# Patient Record
Sex: Female | Born: 1970 | Hispanic: No | Marital: Married | Smoking: Never smoker
Health system: Southern US, Community
[De-identification: ages and names within clinical notes are randomized; demographics above are authoritative.]

---

## 2007-03-07 ENCOUNTER — Inpatient Hospital Stay (HOSPITAL_COMMUNITY): Admission: AD | Admit: 2007-03-07 | Discharge: 2007-03-07 | Payer: Self-pay | Admitting: Obstetrics and Gynecology

## 2007-03-11 ENCOUNTER — Inpatient Hospital Stay (HOSPITAL_COMMUNITY): Admission: AD | Admit: 2007-03-11 | Discharge: 2007-03-13 | Payer: Self-pay | Admitting: Obstetrics and Gynecology

## 2007-03-17 ENCOUNTER — Ambulatory Visit: Admission: RE | Admit: 2007-03-17 | Discharge: 2007-03-17 | Payer: Self-pay

## 2010-02-17 ENCOUNTER — Encounter: Payer: Self-pay | Admitting: Family Medicine

## 2010-02-18 ENCOUNTER — Encounter: Payer: Self-pay | Admitting: Obstetrics and Gynecology

## 2010-06-11 NOTE — H&P (Signed)
NAMELILLYEN, SCHOW NO.:  [id number]   MEDICAL RECORD NO.:  [id number]          PATIENT TYPE:  INP   LOCATION:  9173                          FACILITY:  [facility]   PHYSICIAN:  Osborn Coho, M.D.   DATE OF BIRTH:  October 14, 1970   DATE OF ADMISSION:  03/11/2007  DATE OF DISCHARGE:                              HISTORY & PHYSICAL   HISTORY OF PRESENT ILLNESS:  The patient is a 39 year old, married,  Arabic female, gravida 4, para 2-0-1-2 at 40-4/7 weeks for an [facility] of  March 07, 2007, who presents with questionable leakage of fluids since  8:30 a.m. on February 11 and a pinkish bloody show.  She reports  sporadic uterine contractions, fatigue, slight nausea early this morning  without vomiting.  The patient was seen Tuesday in the office.  Cervix  was 2.5, 70, and -2 and membranes were stripped.  The patient's cervix  at that time was posterior to the patient's left.  The patient denies  any PIH signs or symptoms, dysuria, any constipation or diarrhea,  shortness of breath, fever, cough, or other signs or symptoms of  infection.  Her pregnancy has been followed by the CNM service at  [facility].  She was a transfer of care around 19+ weeks from  [facility].  Her history is remarkable for (1) first trimester spotting,  (2) GBS negative, (3) RH positive blood type, (4) a cystectomy in 06.  The patient does report good fetal movement on admission.  She is  expecting a female.   PRENATAL LABS:  The patient is B positive, Rh antibody screen negative,  RPR nonreactive, rubella titer immune, hepatitis surface antigen  negative, HIV nonreactive, normal hemoglobin electrophoresis.  Gonorrhea  and chlamydia cultures on September 16 were negative.  Hemoglobin at  that time was 12.2, and platelets were 227.  Her group beta Strep was  negative.  She had a normal one-hour DTT, and her hemoglobin on November  17 was 11.6.   ALLERGIES:  THE PATIENT DENIES MEDICATION OR LATEX  ALLERGY OR OTHER  SENSITIVITIES.   OBSTETRICAL HISTORY:  Gravida 1 was spontaneous vaginal delivery July of  2001, female infant weighing 6.5, at [redacted] weeks gestation.  No  complications.  She was an induction and was in [place].  Gravida 2 was a miscarriage without complications around [redacted] weeks  gestation, and that was in March of 2004.  Gravida 3 was a spontaneous  vaginal delivery August of 05, another female infant weighing 6.1 at [redacted]  weeks gestation without complications, and that also was in [place],  [place], and gravida 4 is current pregnancy.   PAST MEDICAL HISTORY:  The patient has report of the miscarriage in  2004.  She reports light spotting with her second and current pregnancy.  Reports condoms for contraception in the past.  Reports last Pap smear  in July of 08 which was within normal limits.  She had a cyst removed in  2006.  Reports chicken pox as a child.   PAST SURGICAL HISTORY:  Unremarkable except for her wisdom teeth.   GENETIC HISTORY:  Unremarkable.   FAMILY HISTORY:  Her mother and maternal grandmother both with chronic  hypertension .   SOCIAL HISTORY:  The patient is a married Arabic female.  She does speak  [place].  She has her Ph.D. is a Holiday representative.  Father  of baby's name is Altomi Hasham.  He works full-time as an Art gallery manager.  Has his master's degree.  They do not report a religious affiliation,  and the patient denied tobacco, alcohol or illicit drug use.   HISTORY OF CURRENT PREGNANCY:  The patient initiated care with [facility] at 9-1/[redacted] weeks gestation where she had a new OB interview.  She  was transferring care from a private practice in [facility].  She had  her new OB at 19-4/7 weeks and had reported at that time some spotting  times one day in the pregnancy.  She had an anatomy scan around 20- 5/7  weeks showing SIUP with size consistent with dates.  Cervix was 3.8 cm,  normal fluid, suggestive female.  The  patient's pregnancy progressed  without any remarkable complications.  Around 35-5/7 weeks, the patient  had GBS done which was negative.  Her vaginal exam at that time was 1  and 50%, vertex, and -1.   PHYSICAL EXAMINATION:  VITAL SIGNS:  On admission, blood pressure  103/64, heart rate 82, temperature 98.4, and respirations were 18.  EFM  showing fetal heart rate around 140, reactive, moderate variability, and  occasional mild variable.  Tocometer showing uterine contractions  approximately every 8 to 10 minutes, mild on palpation.  GENERAL:  In no acute distress, alert and oriented times three.  SKIN:  Warm, dry and intact.  HEENT:  Within normal limits.  CARDIOVASCULAR:  Regular rate and rhythm without murmur.  LUNGS:  Clear to auscultation bilaterally.  ABDOMEN:  Gravid, fundal height approximately 40 cm, estimated fetal  weight 7 to 7-1/2 pounds.  ABDOMEN:  Soft and nontender.  PELVIC EXAM:  Speculum exam:  Negative pulling.  There was a watery  discharge with some bloody mucus which did lead to a positive Nitrazine.  She did, however, have a positive fern.  Cervix was 3.5 cm, 70% effaced,  -2 to -1 and vertex, and cervix was anterior.  EXTREMITIES:  Within normal limits.   IMPRESSION:  1. Intrauterine pregnancy at 40-4/7 weeks.  2. Spontaneous rupture of membranes (SROM).  3. Group beta Strep negative.  4. Desires epidural for labor.   PLAN:  1. Admit to birthing suite with Dr. Su Hilt as attending physician.  2. Routine L and D, CNM orders.  3. Plan ambulation and recheck cervix, and if no cervical change, plan      Pitocin augmentation p.r.n.  The patient may have epidural p.r.n.      and consult with Dr. Su Hilt as needed.      Candice [place], [place]      Osborn Coho, M.D.  Electronically Signed    CHS/MEDQ  D:  03/11/2007  T:  03/11/2007  Job:  [id number]

## 2010-10-18 LAB — CBC
HCT: 33.6 — ABNORMAL LOW
Hemoglobin: 11.5 — ABNORMAL LOW
Hemoglobin: 12.7
MCHC: 34
MCV: 77.8 — ABNORMAL LOW
Platelets: 174
RBC: 4.32
RBC: 4.82
WBC: 10.5
WBC: 8.1

## 2012-11-16 ENCOUNTER — Other Ambulatory Visit: Payer: Self-pay | Admitting: Family Medicine

## 2012-11-16 DIAGNOSIS — Z1231 Encounter for screening mammogram for malignant neoplasm of breast: Secondary | ICD-10-CM

## 2012-12-14 ENCOUNTER — Ambulatory Visit
Admission: RE | Admit: 2012-12-14 | Discharge: 2012-12-14 | Disposition: A | Discharge status: Home / Self Care | Payer: BC Managed Care – PPO | Source: Ambulatory Visit | Attending: Family Medicine | Admitting: Family Medicine

## 2012-12-14 DIAGNOSIS — Z1231 Encounter for screening mammogram for malignant neoplasm of breast: Secondary | ICD-10-CM

## 2014-09-08 ENCOUNTER — Other Ambulatory Visit: Payer: Self-pay

## 2014-09-08 DIAGNOSIS — Z1231 Encounter for screening mammogram for malignant neoplasm of breast: Secondary | ICD-10-CM

## 2014-10-20 ENCOUNTER — Ambulatory Visit: Payer: Self-pay

## 2014-11-20 ENCOUNTER — Ambulatory Visit
Admission: RE | Admit: 2014-11-20 | Discharge: 2014-11-20 | Disposition: A | Discharge status: Home / Self Care | Payer: BLUE CROSS/BLUE SHIELD | Source: Ambulatory Visit

## 2014-11-20 DIAGNOSIS — Z1231 Encounter for screening mammogram for malignant neoplasm of breast: Secondary | ICD-10-CM

## 2014-11-27 ENCOUNTER — Encounter: Payer: Self-pay | Admitting: Skilled Nursing Facility1

## 2014-11-27 ENCOUNTER — Encounter: Payer: BLUE CROSS/BLUE SHIELD | Attending: Obstetrics | Admitting: Skilled Nursing Facility1

## 2014-11-27 VITALS — Ht 64.0 in | Wt 162.0 lb

## 2014-11-27 DIAGNOSIS — Z713 Dietary counseling and surveillance: Secondary | ICD-10-CM | POA: Insufficient documentation

## 2014-11-27 DIAGNOSIS — R635 Abnormal weight gain: Secondary | ICD-10-CM | POA: Insufficient documentation

## 2014-11-27 NOTE — Patient Instructions (Signed)
-  Try to slow down when you eat; try 15 chews -Try to add a little more protein to your breakfast -Trial and error for your portion sizes -Try Kefir -Add a third day of exercise; try the elliptical or look into a trainer for weights

## 2014-11-27 NOTE — Progress Notes (Signed)
  Medical Nutrition Therapy:  Appt start time: 0800 end time:  0900.   Assessment:  Primary concerns today: referred for abnormal weight gain. Pt would like to lose weight. Pt states she loves sweets. Pt states last year she started a running group. Pt states her usual weight was 140 pounds for about a year and a half but that was when she was on the [employer] diet. Pts stated diet hx: juicing and pills. Pt states 160 pounds is her cut off weight and is worried to be over that weight. Pt states she has not hit menopause. Pt states she has no medical issues she just does not want to be that heavy. Pt complains of no symptoms.  Preferred Learning Style:   No preference indicated Learning Readiness:   Contemplating  MEDICATIONS: none   DIETARY INTAKE:  Usual eating pattern includes 3 meals and 2-3 snacks per day.  Everyday foods include none stated.  Avoided foods include pork.    24-hr recall:  B ( AM): toast with peanut butter and honey-----yogurt with granola and honey Snk ( AM): granola bar L ( PM): [place] sandwhich  Snk ( PM): banana----cookies D ( PM): salad, carbohydrate, vegetable, meat Snk ( PM): chocolate and fruit Beverages: coffee, infused water, tea  Usual physical activity: 2 times a week running group for 2.5 to 3 miles about 40 minutes  Estimated energy needs: 1600 calories 180 g carbohydrates 120 g protein 44 g fat  Progress Towards Goal(s):  In progress.   Nutritional Diagnosis:  NB-1.1 Food and nutrition-related knowledge deficit As related to no prior nutrition education from a nutrition professional.  As evidenced by pt report and 24 hr recall..    Intervention:  Nutrition counseling for weight loss. Dietitian educated the pt on health/body acceptance, balanced/varied meals, and increasing her physical activity routine. Goals: -Try to slow down when you eat; try 15 chews -Try to add a little more protein to your breakfast -Trial and error for your portion  sizes -Try Kefir -Add a third day of exercise; try the elliptical or look into a trainer for weights Teaching Method Utilized:  Visual Auditory  Handouts given during visit include:  Snack sheet  Barriers to learning/adherence to lifestyle change: none identified  Demonstrated degree of understanding via:  Teach Back   Monitoring/Evaluation:  Dietary intake, exercise, and body weight prn.

## 2016-11-09 IMAGING — MG MM SCREENING BREAST TOMO BILATERAL
6 of 9 series · 6 of 25 positions shown · non-contrast
Comparison: Previous exam(s).

CLINICAL DATA: Screening.

EXAM:
DIGITAL SCREENING BILATERAL MAMMOGRAM WITH 3D TOMO WITH CAD

[R MLO (1 of 2)]
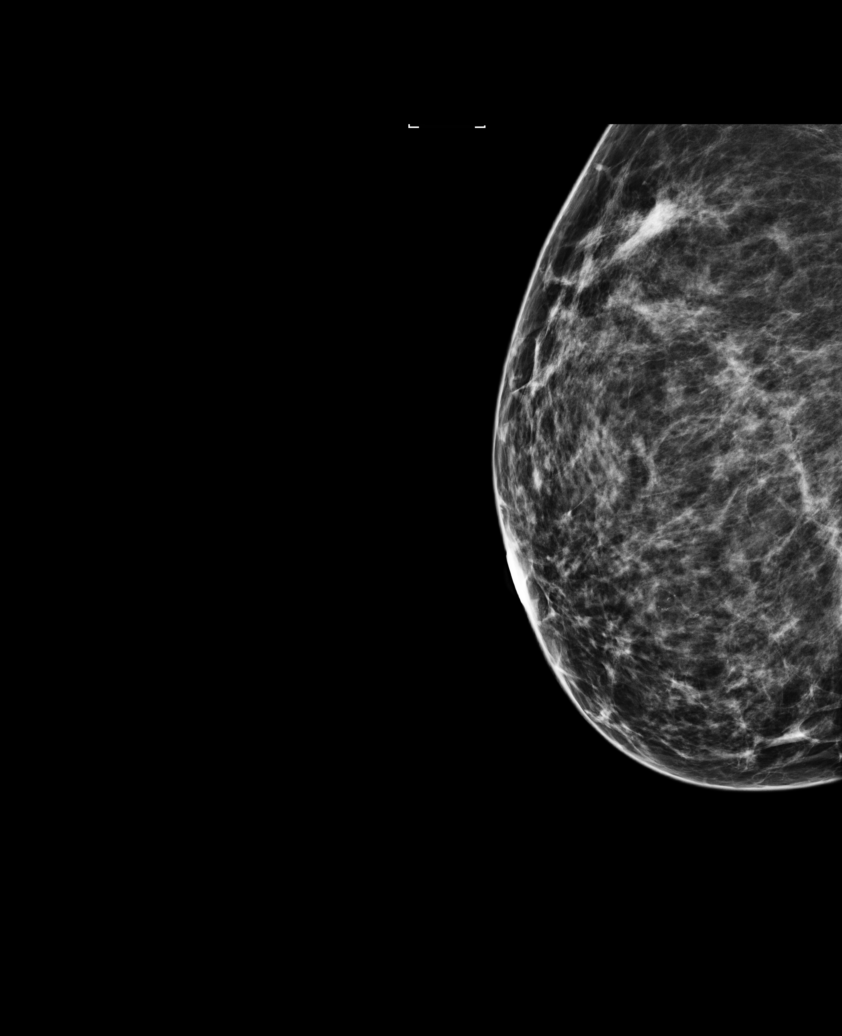

[L CC]
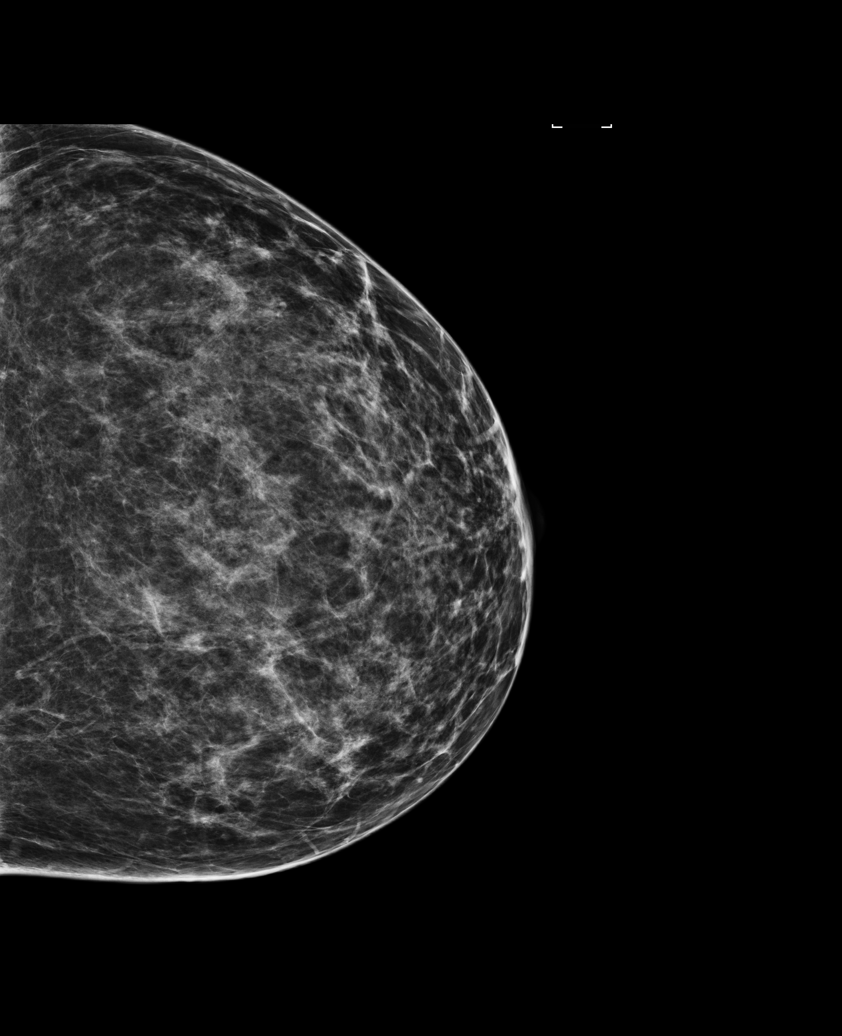

[L MLO]
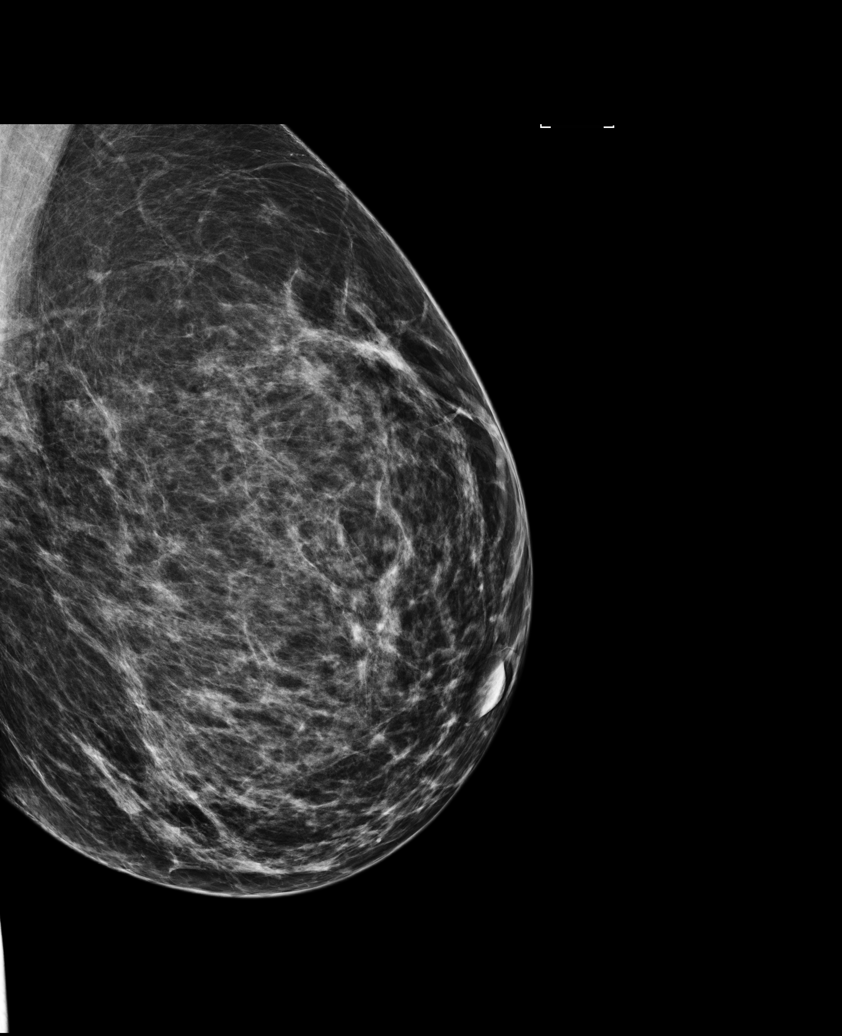

[R CC]
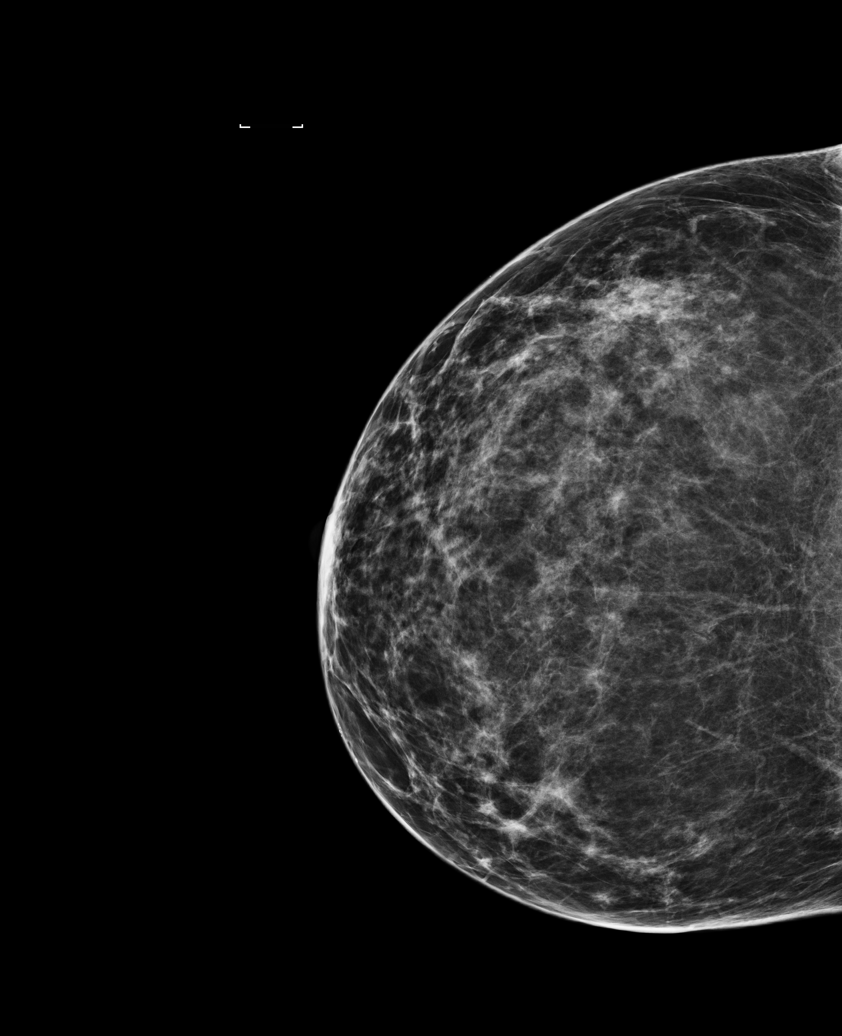

[R MLO (2 of 2)]
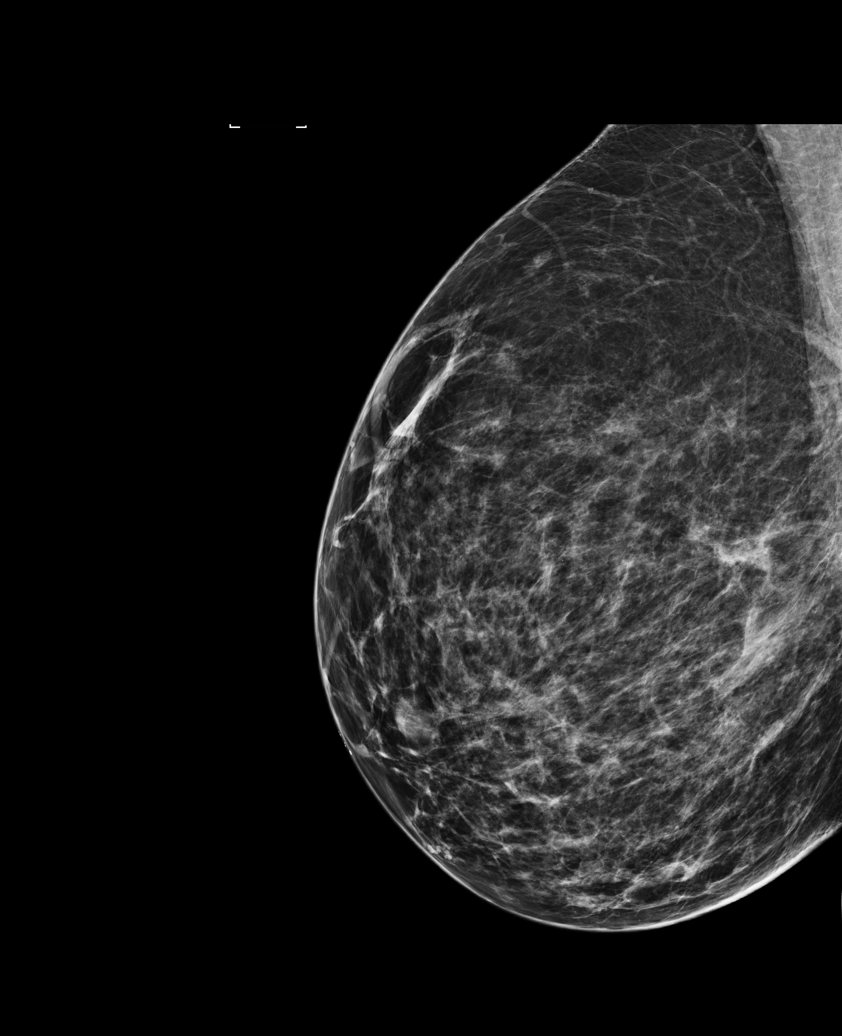

[R MLO tomo · tomo slice 41/80.0]
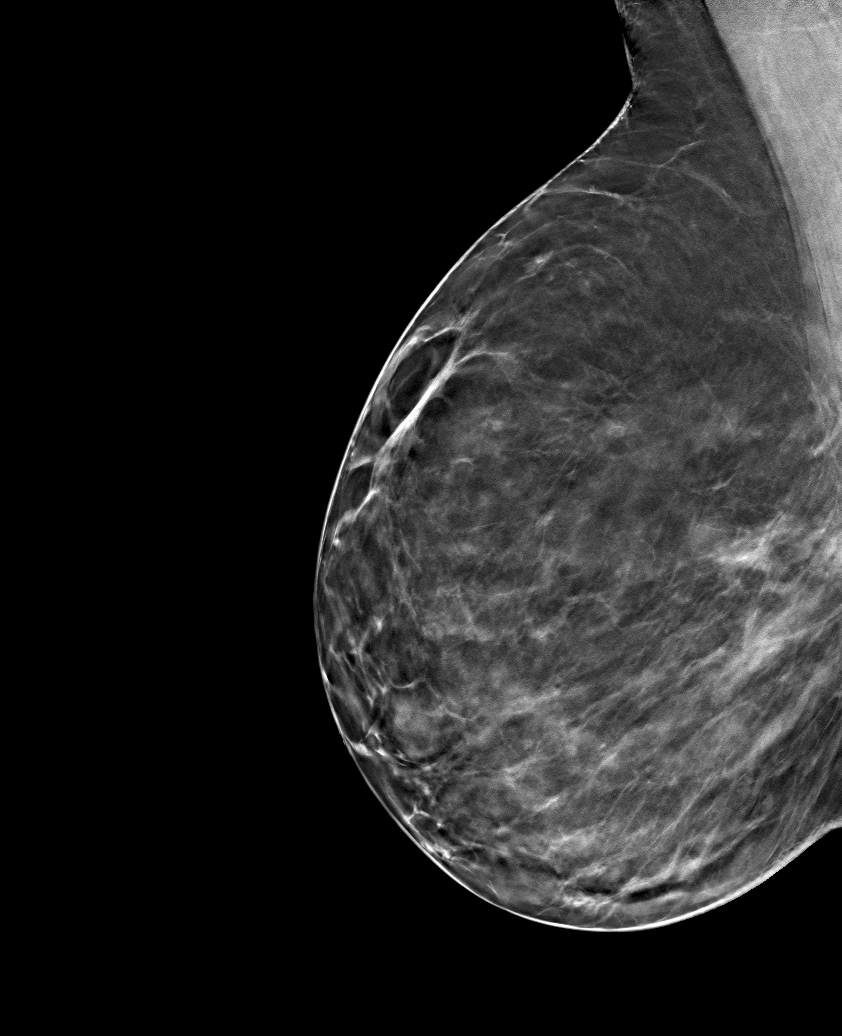

[6 of 25 positions shown; findings below may reference images not displayed]

ACR Breast Density Category b: There are scattered areas of
fibroglandular density.
FINDINGS: There are no findings suspicious for malignancy. Images were
processed with CAD.
IMPRESSION: No mammographic evidence of malignancy. A result letter of this
screening mammogram will be mailed directly to the patient.

RECOMMENDATION:
Screening mammogram in one year. (Code:55-L-23V)

BI-RADS CATEGORY  1: Negative.

## 2017-04-06 ENCOUNTER — Other Ambulatory Visit: Payer: Self-pay | Admitting: Podiatry

## 2017-04-06 ENCOUNTER — Ambulatory Visit: Payer: BLUE CROSS/BLUE SHIELD | Admitting: Podiatry

## 2017-04-06 ENCOUNTER — Ambulatory Visit (INDEPENDENT_AMBULATORY_CARE_PROVIDER_SITE_OTHER): Payer: BLUE CROSS/BLUE SHIELD

## 2017-04-06 DIAGNOSIS — M21619 Bunion of unspecified foot: Secondary | ICD-10-CM

## 2017-04-06 DIAGNOSIS — M2042 Other hammer toe(s) (acquired), left foot: Secondary | ICD-10-CM | POA: Diagnosis not present

## 2017-04-06 DIAGNOSIS — M2041 Other hammer toe(s) (acquired), right foot: Secondary | ICD-10-CM

## 2017-04-06 DIAGNOSIS — M7751 Other enthesopathy of right foot: Secondary | ICD-10-CM | POA: Diagnosis not present

## 2017-04-06 DIAGNOSIS — R52 Pain, unspecified: Secondary | ICD-10-CM

## 2017-04-06 MED ORDER — MELOXICAM 15 MG PO TABS: 15.0000 mg | ORAL_TABLET | Freq: Every day | ORAL | 0 refills | Status: DC

## 2017-04-06 NOTE — Progress Notes (Signed)
   Subjective:    Patient ID: Lisa Brennan, female    DOB: April 24, 1970, 47 y.o.   MRN: [id number]  HPI    Review of Systems  All other systems reviewed and are negative.      Objective:   Physical Exam        Assessment & Plan:

## 2017-04-06 NOTE — Addendum Note (Signed)
Addended by: Felecia ShellingEVANS, Carley Glendenning M on: 04/06/2017 09:02 AM   Modules accepted: Orders

## 2017-04-06 NOTE — Progress Notes (Addendum)
Patient ID: Lisa Brennan, female   DOB: 11-30-1970, 47 y.o.   MRN: [id number]   Subjective: 47 year old female presents the office today for evaluation of pain and tenderness the first and second toes of the right foot.  She states that the bump on the inside of her foot hurts with certain shoe gear and with prolonged periods of standing.  She experience the pain for the past few months.  She has not really tried anything to alleviate symptoms.  She is not interested in surgery and would like to pursue conservative modalities to see if she can alleviate symptoms.    Objective: Physical Exam General: The patient is alert and oriented x3 in no acute distress.  Dermatology: Skin is cool, dry and supple bilateral lower extremities. Negative for open lesions or macerations.  Vascular: Palpable pedal pulses bilaterally. No edema or erythema noted. Capillary refill within normal limits.  Neurological: Epicritic and protective threshold grossly intact bilaterally.   Musculoskeletal Exam: Clinical evidence of bunion deformity noted to the respective foot. There is a moderate pain on palpation range of motion of the first MPJ. Lateral deviation of the hallux noted consistent with hallux abductovalgus. Hammertoe contracture also noted on clinical exam to digits second digits of the bilateral foot. Symptomatic pain on palpation and range of motion also noted to the metatarsal phalangeal joints of the respective hammertoe digits.    Radiographic Exam: Increased intermetatarsal angle greater than 15 with a hallux abductus angle greater than 30 noted on AP view. Moderate degenerative changes noted within the first MPJ. Contracture deformity also noted to the interphalangeal joints and MPJs of the digits of the respective hammertoes.    Assessment: 1. 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 Hammertoe deformity bilateral 3.  Metatarsophalangeal capsulitis second MPJ right   Plan of Care:  1.  Patient was evaluated. X-Rays reviewed. 2.  Today explained that the best alternative to alleviate some pain and pressure from the second MPJ and also to alleviate bunion pain is wide fitting shoe gear.  The patient has a history of buying larger sizes to accommodate the foot pathology, however I recommend find the appropriate size shoe and a wide width. 3.  Also appointment today with Lisa Brennan, Pedorthist for custom molded orthotics with possible metatarsal offloading pad to alleviate pressure from the second MPJ bilateral 4.  Prescription for meloxicam 15 mg to take as needed 5.  Return to clinic as needed  * Name is pronounced 'Nay-head'.  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   Felecia ShellingBrent M. Foye Damron, DPM [facility]  Dr. Felecia ShellingBrent M. Sequan Auxier, DPM    [address]                                        [place], [place] [address]                Office [phone]  Fax [phone]

## 2017-04-20 ENCOUNTER — Other Ambulatory Visit: Payer: BLUE CROSS/BLUE SHIELD | Admitting: Orthotics

## 2017-09-03 DIAGNOSIS — E785 Hyperlipidemia, unspecified: Secondary | ICD-10-CM | POA: Insufficient documentation

## 2017-09-03 DIAGNOSIS — E663 Overweight: Secondary | ICD-10-CM | POA: Insufficient documentation

## 2017-09-14 ENCOUNTER — Encounter: Payer: Self-pay | Admitting: Sports Medicine

## 2017-09-14 ENCOUNTER — Ambulatory Visit: Payer: PRIVATE HEALTH INSURANCE | Admitting: Sports Medicine

## 2017-09-14 VITALS — BP 122/82 | Ht 64.0 in | Wt 160.0 lb

## 2017-09-14 DIAGNOSIS — M7741 Metatarsalgia, right foot: Secondary | ICD-10-CM

## 2017-09-14 NOTE — Patient Instructions (Signed)
  You have metatarsalgia in your right foot. Please try the metatarsal pad in your running shoes. If it is helpful, then you can order more from from [facility].  I think you have either a little synovitis or arthritis in the pointer finger of your right hand. I would recommend over-the-counter treatments such as Aspercreme or capsaicin as needed. You can also try a Band-Aid splint over the joint at night.

## 2017-09-15 ENCOUNTER — Encounter: Payer: Self-pay | Admitting: Sports Medicine

## 2017-09-15 NOTE — Progress Notes (Signed)
   Subjective:    Patient ID: Lisa Brennan, female    DOB: 30-Jul-1970, 47 y.o.   MRN: [id number]  HPI chief complaint: Right foot pain and right index finger pain  47 year old female comes in today with a couple of different complaints. Main complaint is right foot pain she localizes to the plantar aspect of her foot directly over the second metatarsal head.She saw a podiatrist here locally who told her that her pain may be from her bunion.She denies any pain in the first MTP joint. She denies numbness and tingling although she does endorse some numbness when running. She enjoys running and has been running for about 5 years. She does not recall any trauma to her foot. She's not noticed any swelling. She's also complaining of intermittent discomfort at the PIP joint of the right index finger. No trauma. She does notice occasional swelling. She denies pain elsewhere in her hands or wrists.  Past medical history reviewed Medications reviewed Allergies reviewed    Review of Systems    as above Objective:   Physical Exam  Well-developed, well-nourished. No acute distress. Awake alert and oriented 3. Vital signs reviewed.  Right foot: Patient is tender to palpation directly over the second metatarsal head on the plantar aspect of the foot. There is a slight amount of callus formation here as. No palpable neuroma. No soft tissue swelling. No tenderness to palpation across the dorsum of the foot. No pain with metatarsal squeeze. Examination of her foot in the standing position shows a moderate bunion with slight hallux valgus but she has no tenderness to palpation directly over the first MTP joint. Mild hallux rigidus. Longitudinal arch is fairly well preserved but there is a complete collapse of the transverse arches bilaterally. Good pulses. She walks without a limp.  Examination of the right hand with attention to the right index finger does show some mild fusiform swelling at the PIP joint.  No effusion. Joint is not warm to touch. Slight tenderness to palpation along the ulnar aspect of the joint. Good joint stability. Full range of motion. Brisk capillary refill.      Assessment & Plan:   Right foot pain secondary to metatarsalgia Right index finger PIP pain and swelling likely secondary to synovitis versus mild osteoarthritis  Green insert with metatarsal pad for her metatarsalgia. She did not bring her running shoes with her so we educated her on proper placement of the pad. If she struggles with this at home, she may return to the office and we can help her place the pad in the correct position. I recommended no workup or treatment in regards to her finger pain other than over-the-counter remedies such as Aspercreme or capsaicin. She may also try a simple Band-Aid splint over the joint at night if she finds it difficult to sleep. Follow-up with me as needed.

## 2017-10-28 DIAGNOSIS — S76011A Strain of muscle, fascia and tendon of right hip, initial encounter: Secondary | ICD-10-CM | POA: Insufficient documentation

## 2017-12-22 ENCOUNTER — Encounter

## 2017-12-22 ENCOUNTER — Encounter: Payer: Self-pay | Admitting: Sports Medicine

## 2017-12-22 ENCOUNTER — Ambulatory Visit (INDEPENDENT_AMBULATORY_CARE_PROVIDER_SITE_OTHER): Payer: PRIVATE HEALTH INSURANCE

## 2017-12-22 ENCOUNTER — Ambulatory Visit: Payer: PRIVATE HEALTH INSURANCE | Admitting: Sports Medicine

## 2017-12-22 ENCOUNTER — Other Ambulatory Visit: Payer: Self-pay

## 2017-12-22 DIAGNOSIS — M7751 Other enthesopathy of right foot: Secondary | ICD-10-CM

## 2017-12-22 DIAGNOSIS — M79671 Pain in right foot: Secondary | ICD-10-CM

## 2017-12-22 MED ORDER — TRIAMCINOLONE ACETONIDE 10 MG/ML IJ SUSP
10.0000 mg | Freq: Once | INTRAMUSCULAR | Status: AC
  Administered 2017-12-22: 10 mg

## 2017-12-22 NOTE — Patient Instructions (Signed)
Capsulitis of the Second Toe What Is Capsulitis of the Second Toe?  Ligaments surrounding the joint at the base of the second toe form a capsule, which helps the joint to function properly. Capsulitis is a condition in which these ligaments have become inflamed. Although capsulitis can also occur in the joints of the third or fourth toes, it most commonly affects the second toe. This inflammation causes considerable discomfort and, if left untreated, can eventually lead to a weakening of surrounding ligaments that can cause dislocation of the toe. Capsulitis-also referred to as predislocation syndrome-is a common condition that can occur at any age.  Causes  It is generally believed that capsulitis of the second toe is a result of abnormal foot mechanics, where the ball of the foot beneath the toe joint takes an excessive amount of weightbearing pressure. Certain conditions or characteristics can make a person prone to experiencing excessive pressure on the ball of the foot. These most commonly include a severe bunion deformity, a second toe longer than the big toe, an arch that is structurally unstable and a tight calf muscle. Symptoms Because capsulitis of the second toe is a progressive disorder and usually worsens if left untreated, early recognition and treatment are important. In the earlier stages-the best time to seek treatment-the symptoms may include: Pain, particularly on the ball of the foot. It can feel like there's a marble in the shoe or a sock is bunched up  Swelling in the area of pain, including the base of the toe  Difficulty wearing shoes  Pain when walking barefoot In more advanced stages, the supportive ligaments weaken, leading to failure of the joint to stabilize the toe. The unstable toe drifts toward the big toe and eventually crosses over and lies on top of the big toe-resulting in crossover toe, the end stage of capsulitis. The symptoms of crossover toe are the same as those  experienced during the earlier stages. Although the crossing over of the toe usually occurs over a period of time, it can appear more quickly if caused by injury or overuse. Diagnosis An accurate diagnosis is essential because the symptoms of capsulitis can be similar to those of a condition called Morton's neuroma, which is treated differently from capsulitis. In arriving at a diagnosis, the foot and ankle surgeon will examine the foot, press on it and maneuver it to reproduce the symptoms. The surgeon will also look for potential causes and test the stability of the joint. X-rays are usually ordered, and other imaging studies are sometimes needed. Nonsurgical Treatment The best time to treat capsulitis of the second toe is during the early stages, before the toe starts to drift toward the big toe. At that time, nonsurgical approaches can be used to stabilize the joint, reduce the symptoms and address the underlying cause of the condition. The foot and ankle surgeon may select one or more of the following options for early treatment of capsulitis: Rest and ice. Staying off the foot and applying ice packs help reduce the swelling and pain. Apply an ice pack, placing a thin towel between the ice and the skin. Use ice for 20 minutes and then wait at least 40 minutes before icing again.  Injection/Oral medications. Steroid and Nonsteroidal anti-inflammatory drugs (NSAIDs), such as ibuprofen, may help relieve the pain and inflammation.  Taping/splinting. It may be necessary to tape the toe so that it will stay in the correct position. This helps relieve the pain and prevent further drifting of the toe.  Stretching. Stretching exercises may be prescribed for patients who have tight calf muscles.  Shoe modifications. Supportive shoes with stiff soles are recommended because they control the motion and lessen the amount of pressure on the ball of the foot.  Orthotic devices. Custom shoe inserts are often very  beneficial. These include arch supports or a metatarsal pad that distributes the weight away from the joint. When Is Surgery Needed? Once the second toe starts moving toward the big toe, it will never go back to its normal position unless surgery is performed. The foot and ankle surgeon will select the procedure or combination of procedures best suited to the individual patient.

## 2017-12-22 NOTE — Progress Notes (Signed)
Subjective: Danilyn Cocke is a 47 y.o. female patient who presents to office for evaluation of continued right foot pain. Patient complains of progressive pain especially over the last 10 months in the ball of the foot. Ranks pain 5-7/10 and is now interferring with daily activities and reports that she even had to stop running due to the pain.  Patient reports that pain is constant anytime she is walking and putting pressure to the ball of the foot.  Patient reports that after last visit she ended up seeing a another foot doctor who said she had metatarsalgia and recommended some padding but the area still not get better. Patient denies any other pedal complaints. Denies injury/trip/fall/sprain/any causative factors that could have contributed to this pain besides doing a lot of walking in [place] during a trip and then after she came back had pain ever since.  Review of Systems  Musculoskeletal: Positive for joint pain.  All other systems reviewed and are negative.    Patient Active Problem List   Diagnosis Date Noted  . Strain of hip flexor, right, initial encounter 10/28/2017  . Dyslipidemia 09/03/2017  . Overweight 09/03/2017    Current Outpatient Medications on File Prior to Visit  Medication Sig Dispense Refill  . meloxicam (MOBIC) 15 MG tablet Take 1 tablet (15 mg total) by mouth daily. 30 tablet 0  . PARAGARD INTRAUTERINE COPPER IUD IUD ParaGard T 380A 380 square mm intrauterine device  Take by intrauterine route.     No current facility-administered medications on file prior to visit.     No Known Allergies  Objective:  General: Alert and oriented x3 in no acute distress  Dermatology: No open lesions bilateral lower extremities, no webspace macerations, no ecchymosis bilateral, all nails x 10 are well manicured.  Vascular: Dorsalis Pedis and Posterior Tibial pedal pulses palpable, Capillary Fill Time 3 seconds,(+) pedal hair growth bilateral, no edema bilateral lower  extremities, Temperature gradient within normal limits.  Neurology: Michaell Cowing sensation intact via light touch bilateral.  Musculoskeletal: Mild tenderness with palpation at plantar second metatarsal head right foot consistent with capsulitis with mild bunion deformity and likely functional hallux limitus with overload at the second metatarsophalangeal joint on right with very minimal shear callus noted likely secondary to biomechanics, Strength within normal limits in all groups bilateral.   Gait: Antalgic gait  Xrays  Right foot   Impression: Normal osseous mineralization there is mild bunion deformity and long second metatarsal noted with mild decrease second metatarsophalangeal joint space.  Mild soft tissue swelling.  No other acute findings.  Assessment and Plan: Problem List Items Addressed This Visit    None    Visit Diagnoses    Capsulitis of metatarsophalangeal (MTP) joint of right foot    -  Primary   Relevant Medications   triamcinolone acetonide (KENALOG) 10 MG/ML injection 10 mg (Completed)   Other Relevant Orders   DG Foot Complete Right (Completed)   Right foot pain           -Complete examination performed -Xrays reviewed -Discussed treatement options likely capsulitis secondary to foot biomechanics -After oral consent and aseptic prep, injected a mixture containing 1 ml of 2%  plain lidocaine, 1 ml 0.5% plain marcaine, 0.5 ml of kenalog 10 and 0.5 ml of dexamethasone phosphate into right second metatarsophalangeal joint via dorsal approach without complication. Post-injection care discussed with patient.  -Apply plantar foot strapping to second toe and advised patient to do the same thing daily and advised patient at  bedtime to remove taping and use Darco toe splint as dispensed at today's visit -Advised patient next visit we will reassess her for the need of orthotics and also allow her to see Raiford NobleRick to have these orthotics molded to help long-term with keeping pain and  overuse of the joints minimal to prevent flare if her pain is better next visit -Recommend continue with good supportive shoes rest ice elevation and topical pain creams and rubs as needed -Advised patient to avoid strenuous activity that could increase pain to ball of foot -Patient to return to office within 3 weeks or sooner if condition worsens.  Asencion Islamitorya Olamide Carattini, DPM

## 2018-01-12 ENCOUNTER — Encounter: Payer: Self-pay | Admitting: Sports Medicine

## 2018-01-12 ENCOUNTER — Ambulatory Visit: Payer: PRIVATE HEALTH INSURANCE | Admitting: Sports Medicine

## 2018-01-12 DIAGNOSIS — M79671 Pain in right foot: Secondary | ICD-10-CM

## 2018-01-12 DIAGNOSIS — M2041 Other hammer toe(s) (acquired), right foot: Secondary | ICD-10-CM | POA: Diagnosis not present

## 2018-01-12 DIAGNOSIS — M2042 Other hammer toe(s) (acquired), left foot: Secondary | ICD-10-CM

## 2018-01-12 DIAGNOSIS — M21619 Bunion of unspecified foot: Secondary | ICD-10-CM

## 2018-01-12 DIAGNOSIS — M7751 Other enthesopathy of right foot: Secondary | ICD-10-CM | POA: Diagnosis not present

## 2018-01-12 NOTE — Progress Notes (Signed)
Subjective: Lisa Brennan is a 47 y.o. female patient who follows up for evaluation of right foot pain; patient reports that her foot is doing better. No pain at all. Reports injection last visit helped. Patient as also been taping her toe which also helps. Patient denies any other pedal complaints. No changes with health or medications since last visit.    Patient Active Problem List   Diagnosis Date Noted  . Strain of hip flexor, right, initial encounter 10/28/2017  . Dyslipidemia 09/03/2017  . Overweight 09/03/2017    Current Outpatient Medications on File Prior to Visit  Medication Sig Dispense Refill  . LOMAIRA 8 MG TABS     . meloxicam (MOBIC) 15 MG tablet Take 1 tablet (15 mg total) by mouth daily. 30 tablet 0  . PARAGARD INTRAUTERINE COPPER IUD IUD ParaGard T 380A 380 square mm intrauterine device  Take by intrauterine route.    . Phentermine HCl 8 MG TABS 1 tab, po, BID    . triamcinolone ointment (KENALOG) 0.1 % triamcinolone acetonide 0.1 % topical ointment  APPLY A THIN LAYER TO THE AFFECTED AREA(S) BY TOPICAL ROUTE 2 TIMES PER DAY    . Vitamin D, Ergocalciferol, (DRISDOL) 1.25 MG (50000 UT) CAPS capsule      No current facility-administered medications on file prior to visit.     No Known Allergies  Objective:  General: Alert and oriented x3 in no acute distress  Dermatology: No open lesions bilateral lower extremities, no webspace macerations, no ecchymosis bilateral, all nails x 10 are well manicured.  Vascular: Dorsalis Pedis and Posterior Tibial pedal pulses palpable, Capillary Fill Time 3 seconds,(+) pedal hair growth bilateral, no edema bilateral lower extremities, Temperature gradient within normal limits.  Neurology: Gross sensation intact via light touch bilateral.  Musculoskeletal:No tenderness with palpation at plantar second metatarsal head right foot, + mild bunion deformity and likely functional hallux limitus with overload at the second  metatarsophalangeal joint on right with very minimal shear callus noted likely secondary to biomechanics, Strength within normal limits in all groups bilateral.   Assessment and Plan: Problem List Items Addressed This Visit    None    Visit Diagnoses    Right foot pain    -  Primary   Capsulitis of metatarsophalangeal (MTP) joint of right foot       Bunion       Hammer toes of both feet           -Complete examination performed -Re-Discussed treatement options and long term care for capsulitis secondary to foot biomechanics -Patient seen by Ric for orthotics to help offload sub 2 on right -Continue with toe splinting until she has got her orthotics -Recommend continue with good supportive shoes rest ice elevation and topical pain creams and rubs as needed -Refrain from running until she has got her orthotics  -Patient to return to office [facility] or sooner if condition worsens.  Asencion Islamitorya Analleli Gierke, DPM

## 2018-01-18 ENCOUNTER — Ambulatory Visit (INDEPENDENT_AMBULATORY_CARE_PROVIDER_SITE_OTHER): Payer: PRIVATE HEALTH INSURANCE | Admitting: Orthotics

## 2018-01-18 DIAGNOSIS — M7752 Other enthesopathy of left foot: Secondary | ICD-10-CM

## 2018-01-18 DIAGNOSIS — M7751 Other enthesopathy of right foot: Secondary | ICD-10-CM | POA: Diagnosis not present

## 2018-01-18 DIAGNOSIS — M79671 Pain in right foot: Secondary | ICD-10-CM

## 2018-01-18 NOTE — Progress Notes (Signed)
Patient came into today to be cast for Custom Foot Orthotics. Upon recommendation of Dr. Marylene LandStover Patient presents with metatarsalagia, 2nd Rt capulitis Goals are offloading 2nd cap R  Plan vendor [employer]

## 2018-02-08 ENCOUNTER — Ambulatory Visit: Payer: PRIVATE HEALTH INSURANCE | Admitting: Orthotics

## 2018-02-08 DIAGNOSIS — M21619 Bunion of unspecified foot: Secondary | ICD-10-CM

## 2018-02-08 DIAGNOSIS — M2041 Other hammer toe(s) (acquired), right foot: Secondary | ICD-10-CM

## 2018-02-08 DIAGNOSIS — M79671 Pain in right foot: Secondary | ICD-10-CM

## 2018-02-08 DIAGNOSIS — M7751 Other enthesopathy of right foot: Secondary | ICD-10-CM

## 2018-02-08 DIAGNOSIS — M2042 Other hammer toe(s) (acquired), left foot: Secondary | ICD-10-CM

## 2018-02-08 NOTE — Progress Notes (Signed)
Have to send back to Richy to correct, offload is on wrong side.

## 2018-02-22 ENCOUNTER — Other Ambulatory Visit: Payer: PRIVATE HEALTH INSURANCE | Admitting: Orthotics

## 2018-03-02 ENCOUNTER — Encounter: Payer: PRIVATE HEALTH INSURANCE | Admitting: Orthotics

## 2018-03-09 ENCOUNTER — Ambulatory Visit (INDEPENDENT_AMBULATORY_CARE_PROVIDER_SITE_OTHER): Payer: PRIVATE HEALTH INSURANCE | Admitting: Orthotics

## 2018-03-09 DIAGNOSIS — M2042 Other hammer toe(s) (acquired), left foot: Principal | ICD-10-CM

## 2018-03-09 DIAGNOSIS — M2041 Other hammer toe(s) (acquired), right foot: Secondary | ICD-10-CM

## 2018-03-09 NOTE — Progress Notes (Signed)
Patient came in today to pick up custom made foot orthotics.  The goals were accomplished and the patient reported no dissatisfaction with said orthotics.  Patient was advised of breakin period and how to report any issues. 

## 2018-03-29 ENCOUNTER — Telehealth: Payer: Self-pay | Admitting: Sports Medicine

## 2018-03-29 DIAGNOSIS — M7751 Other enthesopathy of right foot: Secondary | ICD-10-CM | POA: Diagnosis not present

## 2018-03-29 DIAGNOSIS — M21612 Bunion of left foot: Secondary | ICD-10-CM | POA: Diagnosis not present

## 2018-03-29 NOTE — Telephone Encounter (Signed)
Pt left message stating she wants to order a second pair of orthotics this pair for athletic style shoes.    I returned call and pt is wanting a second pair order made for the athletic style shoes and is wanting to bill the insurance for them. Pt aware Raiford Noble is out of the office for the next week and they will be ordered when he gets back.

## 2018-04-06 ENCOUNTER — Ambulatory Visit (INDEPENDENT_AMBULATORY_CARE_PROVIDER_SITE_OTHER): Payer: PRIVATE HEALTH INSURANCE | Admitting: Sports Medicine

## 2018-04-06 ENCOUNTER — Encounter: Payer: Self-pay | Admitting: Sports Medicine

## 2018-04-06 DIAGNOSIS — M2042 Other hammer toe(s) (acquired), left foot: Secondary | ICD-10-CM

## 2018-04-06 DIAGNOSIS — S76911D Strain of unspecified muscles, fascia and tendons at thigh level, right thigh, subsequent encounter: Secondary | ICD-10-CM

## 2018-04-06 DIAGNOSIS — M216X1 Other acquired deformities of right foot: Secondary | ICD-10-CM

## 2018-04-06 DIAGNOSIS — S76011D Strain of muscle, fascia and tendon of right hip, subsequent encounter: Secondary | ICD-10-CM

## 2018-04-06 DIAGNOSIS — M7751 Other enthesopathy of right foot: Secondary | ICD-10-CM

## 2018-04-06 DIAGNOSIS — M79671 Pain in right foot: Secondary | ICD-10-CM

## 2018-04-06 DIAGNOSIS — M2041 Other hammer toe(s) (acquired), right foot: Secondary | ICD-10-CM

## 2018-04-06 DIAGNOSIS — M21619 Bunion of unspecified foot: Secondary | ICD-10-CM

## 2018-04-06 MED ORDER — TRIAMCINOLONE ACETONIDE 10 MG/ML IJ SUSP
10.0000 mg | Freq: Once | INTRAMUSCULAR | Status: AC
  Administered 2018-04-06: 10 mg

## 2018-04-06 NOTE — Progress Notes (Signed)
Subjective: Lisa Brennan is a 48 y.o. female patient who follows up for evaluation of right foot pain; patient reports that her pain is slowly coming back of the ball of her right foot states that when she flexes her foot she can feel a pull that radiates over the plantar surface of her foot that radiates to her leg and upper thigh and is unsure if the issues of what is been going on with her hip is contributing to her foot but does state that she got her orthotics roughly about 3 to 4 weeks ago and states that the pain started to slowly come back after wearing her orthotics and is unsure if this is also contributing to increased episode of pain.  Patient states that she has been trying to be active has stopped running and has been doing more low impact exercises in the gym over has no foot and pain is causing her to limp.  Patient denies any other pedal complaints at this time.   Patient Active Problem List   Diagnosis Date Noted  . Strain of hip flexor, right, initial encounter 10/28/2017  . Dyslipidemia 09/03/2017  . Overweight 09/03/2017    Current Outpatient Medications on File Prior to Visit  Medication Sig Dispense Refill  . Phentermine HCl 8 MG TABS 1 tab, po, BID    . LOMAIRA 8 MG TABS     . meloxicam (MOBIC) 15 MG tablet Take 1 tablet (15 mg total) by mouth daily. (Patient not taking: Reported on 04/06/2018) 30 tablet 0  . PARAGARD INTRAUTERINE COPPER IUD IUD ParaGard T 380A 380 square mm intrauterine device  Take by intrauterine route.    . triamcinolone ointment (KENALOG) 0.1 % triamcinolone acetonide 0.1 % topical ointment  APPLY A THIN LAYER TO THE AFFECTED AREA(S) BY TOPICAL ROUTE 2 TIMES PER DAY    . Vitamin D, Ergocalciferol, (DRISDOL) 1.25 MG (50000 UT) CAPS capsule      No current facility-administered medications on file prior to visit.     No Known Allergies  Objective:  General: Alert and oriented x3 in no acute distress  Dermatology: No open lesions bilateral  lower extremities, no webspace macerations, no ecchymosis bilateral, all nails x 10 are well manicured.  Vascular: Dorsalis Pedis and Posterior Tibial pedal pulses palpable, Capillary Fill Time 3 seconds,(+) pedal hair growth bilateral, no edema bilateral lower extremities, Temperature gradient within normal limits.  Neurology: Michaell Cowing sensation intact via light touch bilateral.  Musculoskeletal: There is tenderness with palpation at plantar second metatarsal head right foot, + mild bunion deformity and likely functional hallux limitus with overload at the second metatarsophalangeal joint on right with very minimal shear callus noted likely secondary to biomechanics, there is tight posterior muscle likely contributing to equinus and forefoot overload with issue of lower extremity hip strain, strength within normal limits in all groups bilateral.   Assessment and Plan: Problem List Items Addressed This Visit      Musculoskeletal and Integument   Strain of hip flexor, right, initial encounter    Other Visit Diagnoses    Capsulitis of metatarsophalangeal (MTP) joint of right foot    -  Primary   Relevant Medications   triamcinolone acetonide (KENALOG) 10 MG/ML injection 10 mg (Completed) (Start on 04/06/2018  9:15 PM)   Hammer toes of both feet       Right foot pain       Bunion       Acquired equinus deformity of right foot         -  Complete examination performed -Re-Discussed treatement options and long term care for capsulitis secondary to foot biomechanics and likely worsening overload secondary to issue with hip -After oral consent and aseptic prep, injected a mixture containing 1 ml of 2%  plain lidocaine, 1 ml 0.5% plain marcaine, 0.5 ml of kenalog 10 and 0.5 ml of dexamethasone phosphate into right second metatarsophalangeal joint without complication. Post-injection care discussed with patient.  -Continue with getting used to wearing new custom insoles and patient is awaiting second  pair for athletic shoes -Recommend physical therapy -Recommend continue with good supportive shoes rest ice elevation and topical pain creams and rubs as needed -Refrain from high impact activities to avoid flareup or inflammation at the ball of her right foot -Patient to return to office after completing physical therapy and for pickup second pair of orthotics or sooner if condition worsens.  Asencion Islam, DPM

## 2018-04-07 ENCOUNTER — Telehealth: Payer: Self-pay | Admitting: *Deleted

## 2018-04-07 DIAGNOSIS — S76911D Strain of unspecified muscles, fascia and tendons at thigh level, right thigh, subsequent encounter: Secondary | ICD-10-CM

## 2018-04-07 DIAGNOSIS — M2042 Other hammer toe(s) (acquired), left foot: Secondary | ICD-10-CM

## 2018-04-07 DIAGNOSIS — M216X1 Other acquired deformities of right foot: Secondary | ICD-10-CM

## 2018-04-07 DIAGNOSIS — M2041 Other hammer toe(s) (acquired), right foot: Secondary | ICD-10-CM

## 2018-04-07 DIAGNOSIS — M7751 Other enthesopathy of right foot: Secondary | ICD-10-CM

## 2018-04-07 DIAGNOSIS — S76011D Strain of muscle, fascia and tendon of right hip, subsequent encounter: Secondary | ICD-10-CM

## 2018-04-07 NOTE — Telephone Encounter (Signed)
Faxed required form, clinicals to [facility].

## 2018-04-07 NOTE — Telephone Encounter (Signed)
-----   Message from Asencion Islam, [place] sent at 04/06/2018  6:08 PM EDT ----- Regarding: PT R foot and leg Tendonitis, IT Band, Capsulitius and equnius Patient wants a location [place] to [place]

## 2018-10-07 ENCOUNTER — Other Ambulatory Visit: Payer: Self-pay

## 2018-10-07 ENCOUNTER — Ambulatory Visit: Payer: PRIVATE HEALTH INSURANCE | Admitting: Sports Medicine

## 2018-10-07 VITALS — BP 90/68 | Ht 64.0 in | Wt 143.0 lb

## 2018-10-07 DIAGNOSIS — M7751 Other enthesopathy of right foot: Secondary | ICD-10-CM | POA: Diagnosis not present

## 2018-10-07 DIAGNOSIS — M7741 Metatarsalgia, right foot: Secondary | ICD-10-CM | POA: Diagnosis not present

## 2018-10-07 NOTE — Assessment & Plan Note (Signed)
TTP over 2nd MTP, improvement in past with 2nd MTP steroid injection confirming that it is capsulitis causing pain. Current orthotic has custom offloading pad over 2nd MTP. Will add a neuroma pad proximal to 2nd MTP to further offload joint. She will trial this for one month and if she has improvement we can place similar pads in more of her shoes. Hopeful with the right support over MTP she will be able to run again. Ice for 5-10 minutes at a time when foot is painful to help with inflammation. Follow up in 4 weeks.

## 2018-10-07 NOTE — Progress Notes (Signed)
Page Lisa Brennan - 48 y.o. female MRN [id number]  Date of birth: Jan 19, 1971  SUBJECTIVE:   CC: right foot pain   48 yo female with right foot pain for the past 2 years presenting for further evaluation. She has pain on plantar aspect of foot over 2nd metatarsal head. Started two years ago and has been intermittently bothering her. She has been seeing a podiatrist who has given her injections for capsulitis of second MTP joint which help the pain tremendously and provide relief for ~6 months. She has had two injections, the last in March 2020.  The injection worked for 5-6 months but pain has been worsening over the past month. She has also been wearing orthotics with 2nd metatarsal offloading pad made by podiatrist. Foot feels better when she is in orthotics. Is wondering if there is an alternative to steroid injection.  She has had to stop running (last year) due to pain and she has worsened pain when she is walking or on her feet for extended periods of time. Occasionally gets numbness in 2nd toe.  She was seen last year and diagnosed with metatarsalgia, provided metatarsal pad with green insert. She reports that this did not help improve pain.   ROS: No swelling, instability, muscle pain, redness, otherwise see HPI   PMHx - Updated and reviewed.  Contributory factors include: Negative PSHx - Updated and reviewed.  Contributory factors include:  Negative FHx - Updated and reviewed.  Contributory factors include:  Negative Social Hx - Updated and reviewed. Contributory factors include: Negative Medications - reviewed   DATA REVIEWED: Prior records  PHYSICAL EXAM:  VS: BP:90/68  HR: bpm  TEMP: ( )  RESP:   HT:5\' 4"  (162.6 cm)   WT:143 lb (64.9 kg)  BMI:24.53 PHYSICAL EXAM: Gen: NAD, alert, cooperative with exam, well-appearing HEENT: clear conjunctiva,  CV:  no edema, capillary refill brisk, normal rate Resp: non-labored Skin: no rashes, normal turgor  Neuro: no gross deficits.   Psych:  alert and oriented  Right Foot: Inspection:  No soft tissue swelling. Small bunion with mild hallux valgus. Splayed toes with increased splaying between 2nd and 3rd toes, especially with walking. Palpation: TTP at second metarsal head on plantar aspect of foot. No TTP on dorsal aspect of foot ROM: Full  ROM of the ankle. Normal midfoot flexibility.  Strength: 5/5 strength ankle in all planes Neurovascular: N/V intact distally in the lower extremity Special tests: Negative anterior drawer. Negative squeeze. normal midfoot flexibility. Normal calcaneal motion with heel raise  Left foot: Inspection:  No soft tissue swelling. Small bunion with mild hallux valgus.  Palpation: no TTP ROM: Full  ROM of the ankle. Normal midfoot flexibility.  Strength: 5/5 strength ankle in all planes Neurovascular: N/V intact distally in the lower extremity Special tests: Negative anterior drawer. Negative squeeze. normal midfoot flexibility. Normal calcaneal motion with heel raise  ASSESSMENT & PLAN:   Capsulitis of 2nd metatarsophalangeal (MTP) joint of right foot TTP over 2nd MTP, improvement in past with 2nd MTP steroid injection confirming that it is capsulitis causing pain. Current orthotic has custom offloading pad over 2nd MTP. Will add a neuroma pad proximal to 2nd MTP to further offload joint. She will trial this for one month and if she has improvement we can place similar pads in more of her shoes. Hopeful with the right support over MTP she will be able to run again. Ice for 5-10 minutes at a time when foot is painful to help with inflammation. Follow  up in 4 weeks.  I observed and examined the patient with Dr. Mayer Masker and agree with assessment and plan.  Note reviewed and modified by me. Ila Mcgill, MD

## 2018-11-04 ENCOUNTER — Ambulatory Visit: Payer: PRIVATE HEALTH INSURANCE | Admitting: Pediatrics

## 2018-11-18 ENCOUNTER — Telehealth: Payer: Self-pay

## 2018-11-18 NOTE — Telephone Encounter (Signed)
See phone note

## 2018-11-25 ENCOUNTER — Ambulatory Visit: Payer: PRIVATE HEALTH INSURANCE | Admitting: Sports Medicine

## 2018-11-25 ENCOUNTER — Other Ambulatory Visit: Payer: Self-pay

## 2018-11-25 DIAGNOSIS — M7751 Other enthesopathy of right foot: Secondary | ICD-10-CM

## 2018-11-25 NOTE — Assessment & Plan Note (Signed)
See office visit Patient was made a pair of orthotics for sports shoes A second pair of orthotics was made for regular walking and work shoes  I want her to try these for 2 months to see if we can alleviate the pain that tends to recur in her right forefoot

## 2018-11-25 NOTE — Progress Notes (Signed)
CC;right forefoot pain  OV 10/07/18 History of forefoot pain that had persisted for 2 years. She had been followed by podiatry.  Her diagnosis was capsulitis of the second MTP. 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  She came to [place] and got a PhD from [employer].  She now works as a Engineer, maintenance and an Nurse, mental health at [employer]  Review of systems  occasional numbness in the second toe No swelling noted over the foot No pain in the left foot  Physical examination Pleasant female in no acute distress BP 110/70   Ht 5\' 4"  (1.626 m)   Wt 143 lb (64.9 kg)   BMI 24.55 kg/m    Both feet reveal flattening of the longitudinal arch There is no standing pronation however Slight valgus shift of the right first MTP Splaying on the right between toes 2 and 3 Slightly increased separation between toes 1 and 2 Tenderness to palpation just distal to the second MTP joint Metatarsal arch seems flexible and is not painful to movement or palpation  Plan Patient was fitted for a 2 pairs of: standard, cushioned, semi-rigid orthotic. The orthotic was heated and afterward the patient stood on the orthotic blank positioned on the orthotic stand. The patient was positioned in subtalar neutral position and 10 degrees of ankle dorsiflexion in a weight bearing stance. After completion of molding, a stable base was applied to the  orthotic blank. The blank was ground to a stable position for weight bearing. Size: 8 Supercell with padded forefoot pair 1 for sports shoes Size 8 women's Red EVA with foam cushion added Base: Pair 1 built in base/ pair 2 with foam rubber cushion Posting: none Additional orthotic padding:forefoot foam rubber padding to cover 2nd MTP on right on platar suface of orthotic 2/ MT foam padding left orthotic 2  Assess: Metatarsalgia Rt foot  Plan Patient had very good comfort in a neutral walking gait in both pairs of orthotics. She plans to break-in these orthotics and use 1 pair in her exercise shoes and another in most other shoes. After trying these for 2 months she will return If she improves sufficiently we will consider letting her resume a running program

## 2019-02-10 ENCOUNTER — Ambulatory Visit: Payer: PRIVATE HEALTH INSURANCE | Admitting: Sports Medicine

## 2019-02-10 ENCOUNTER — Other Ambulatory Visit: Payer: Self-pay

## 2019-02-10 DIAGNOSIS — M7751 Other enthesopathy of right foot: Secondary | ICD-10-CM

## 2019-02-10 MED ORDER — AMITRIPTYLINE HCL 25 MG PO TABS: 25.0000 mg | ORAL_TABLET | Freq: Every day | ORAL | 3 refills | Status: DC

## 2019-02-10 NOTE — Patient Instructions (Signed)
Take the medicine close to bedtime. If you are too sleepy in the morning you can take it earlier. Side effects are drowsiness and dry mouth. If you are having any unusual side effects please call us. We will continue the medicine for 3 months if you are seeing some benefit.  If not, you may stop it. Call us in a month to let us know how you are doing.  Otherwise we will see you in 3 months for re-evaluation.

## 2019-02-10 NOTE — Progress Notes (Signed)
CC: RT forefoot pain  Now has persisted most of 2 years We made special orthotics with forefoot padding These clearly help as she can walk further They do not take away all of pain Pain is worse late in day and night  ROS No swelling of feet No numbness MT pads did not help  PE Pleasant F in NAD BP 102/72   Ht 5\' 4"  (1.626 m)   Wt 145 lb (65.8 kg)   BMI 24.89 kg/m   Loss of long arch bilat Neutral pes planus RT shows slt valgus shift of first toe Splaying between RT 2 and 3 Only mild TTP over the 2nd MTP joint   Ultrasound of RT. Forefoot  \Very mild hypoechoic change surrounds 2nd MTP No boney abnormality Good motions and intact flexor tendon No spurring No evidence of neuroma  Impression: some minimal swelling at MTP 2 joint  Ultrasound and interpretation by B. Royal Hawthorn, MD

## 2019-02-11 NOTE — Assessment & Plan Note (Addendum)
With slow healing and lack of response to additional orthotic padding wonder about some nerve irritation  Trial on amitriptyline 25 at HS Cont for 3 mos if helping Cont orthotic use  I spent 26 minutes with this patient. Over 50% of visit was spend in counseling and coordination of care for problems with RT forefoot pain.

## 2019-03-04 ENCOUNTER — Other Ambulatory Visit: Payer: Self-pay | Admitting: Sports Medicine

## 2019-03-08 ENCOUNTER — Other Ambulatory Visit: Payer: Self-pay

## 2019-03-08 MED ORDER — AMITRIPTYLINE HCL 25 MG PO TABS: 25.0000 mg | ORAL_TABLET | Freq: Every day | ORAL | 2 refills | Status: DC

## 2019-03-08 NOTE — Progress Notes (Signed)
Request for 90 day prescription

## 2019-11-23 ENCOUNTER — Other Ambulatory Visit: Payer: Self-pay | Admitting: Sports Medicine

## 2019-12-20 ENCOUNTER — Other Ambulatory Visit: Payer: Self-pay

## 2019-12-20 ENCOUNTER — Ambulatory Visit: Payer: PRIVATE HEALTH INSURANCE | Admitting: Sports Medicine

## 2019-12-20 VITALS — BP 102/80 | Ht 64.0 in | Wt 157.0 lb

## 2019-12-20 DIAGNOSIS — M25511 Pain in right shoulder: Secondary | ICD-10-CM | POA: Diagnosis not present

## 2019-12-20 DIAGNOSIS — M7751 Other enthesopathy of right foot: Secondary | ICD-10-CM

## 2019-12-20 NOTE — Patient Instructions (Signed)
It was great to meet you today! Thank you for letting me participate in your care!  Today, we discussed your right foot pain and right shoulder pain. I am glad your right foot pain is controlled with the medication. Please continue taking it and I recommend restarting some exercise such as cycling or swimming. Anything that lessens impact on the foot.   For your shoulder it seems most likely a reaction to the vaccine. Continue taking amitriptyline as this will continue to help with that as well and do some of the exercises we showed you and it will improve.  Follow back up in 6 weeks.  Be well, Jules Schick, DO PGY-4, Sports Medicine Fellow [facility]

## 2019-12-20 NOTE — Assessment & Plan Note (Signed)
Improving with use of amitriptyline however she is not able to wean off at this point.  We can continue at this low-dose as needed.  We did ask her to inspect her orthotics at home and ensure she has a metatarsal pad as she does have evidence of collapse of the transverse arch which could be contributing to her symptoms.  Follow-up as needed.

## 2019-12-20 NOTE — Assessment & Plan Note (Signed)
Given patient symptoms started before the injection however got acutely worse after most likely is a inflammation reaction from the booster shot that made some already present inflammation worse.  Given ultrasound findings and exam findings she does not have a rotator cuff tear but slight inflammation of the subacromial bursa. -Given that the amitriptyline is helping both the foot and the right shoulder we will discontinue this medication and she was given exercises to do -Follow-up in about 4 to 6 weeks.

## 2019-12-20 NOTE — Progress Notes (Addendum)
SUBJECTIVE:   CHIEF COMPLAINT / HPI:   Follow-up for right foot pain Patient states that she has been taking amitriptyline daily every night and has completely resolved her foot pain.  She states is not hurting to walk on it or put pressure on it however she is still not running or doing any strenuous activity.  She did try coming off the amitriptyline and states she can come off it for a day but if she goes without it for 2 to 3 days or does any very strenuous activity like walking for long periods of time the pain will return.  However since she restarted the medicine the pain subsides and goes away after being back on it for several days.  Right shoulder pain Patient states she was lifting weights several weeks ago in the gym and as she was trying to increase her strength and training she increasingly increase the weight of what she was lifting and the shoulder began to get painful more toward the back and around the sides of the shoulder.  She states it got somewhat better with rest and not lifting his heavy of weight however she got the Covid booster shot last week and she feels like since then is been extremely worse.  It is made worse with flexing and abducting her shoulder up to 90 degrees or reaching behind her.  She does not remember any swelling, skin changes, or feeling or hearing a pop in the shoulder.  PERTINENT  PMH / PSH: None  OBJECTIVE:   BP 102/80   Ht 5\' 4"  (1.626 m)   Wt 157 lb (71.2 kg)   BMI 26.95 kg/m   [facility] 12/20/2019  Frequency of aerobic exercise (# of days/week) 3  Average time in minutes 40  Frequency of strengthening activities (# of days/week) 3   Right foot Inspection is significant for bunion of the first metatarsal with splaying of the second third digits of the right foot.  No tenderness to palpation over the head of the second meta tarsal.  Slightly tender to palpation in between the first and second metatarsal heads  of the right foot.  No swelling, no ecchymosis, no erythema, no skin rashes.  Active range of motion at the ankle is full in all planes including flexion, extension, pronation and supination.  Shoulder, right: No evidence of bony deformity, asymmetry, or muscle atrophy; No tenderness over long head of biceps (bicipital groove). No TTP at [facility] joint. TTP at the posterior shoulder and anterior shoulder joint proper. Full active and passive range of motion (180 flex [facility] /150Abd /90ER /70IR), Thumb to T12 without significant tenderness. Strength 5/5 throughout. No abnormal scapular function observed. Sensation intact. Peripheral pulses intact. Special Tests:   - Crossarm test: NEG - Jobe test: NEG   - Hawkins: Positive   - Neer test: NEG   - Painful Arc Test: Positive   - Drop arm test: NEG Limited MSK ultrasound: Right shoulder Complete Ultrasound of Right Shoulder Biceps tendon: well visualized in both transverse and long axis with no effusion surrounding the tendon and no disruption of the tendon fibers. Pec major tendon: well visualized and inserting at the greater head of the humerus Subscapularis: well visualized with no tears, no fiber disruption, no hypoechoic changes AC joint: well visualized, no giser sign present Infraspinatus: well visualized with no disruption of the fibers, no hypoechoic changes Supraspinatus: well visualized with no tears or fiber disruption no hypoechoic changes.  Does  have evidence of slight hypoechoic accumulation superior to the supraspinatus consistent with bursal inflammation. Posterior glenohumeral joint: well visualized Interpretation: Overall, largely normal complete U/S of the shoulder with evidence of subacromial bursitis mild.  [place] Screen Right Shoulder Mild calcification and hypoechoic change at supraspinatus insertion to foot plate Other RC tendons normal Biceps tendon normal  RT 2nd MCP Increased swelling noted before has  resolved  Ultrasound and interpretation by Dr. Verdis Prime and Sibyl Parr. Fields, MD  ASSESSMENT/PLAN:   Right shoulder pain Given patient symptoms started before the injection however got acutely worse after most likely is a inflammation reaction from the booster shot that made some already present inflammation worse.  Given ultrasound findings and exam findings she does not have a rotator cuff tear but slight inflammation of the subacromial bursa. -Given that the amitriptyline is helping both the foot and the right shoulder we will discontinue this medication and she was given exercises to do -Follow-up in about 4 to 6 weeks.  Capsulitis of 2nd metatarsophalangeal (MTP) joint of right foot Improving with use of amitriptyline however she is not able to wean off at this point.  We can continue at this low-dose as needed.  We did ask her to inspect her orthotics at home and ensure she has a metatarsal pad as she does have evidence of collapse of the transverse arch which could be contributing to her symptoms.  Follow-up as needed.     Arlyce Harman, DO PGY-4, Sports Medicine Fellow [facility]  I observed and examined the patient with the [facility] resident and agree with assessment and plan.  Note reviewed and modified by me. KB Fields,MD  Note conclusion should state since amitriptyline was helping it will be continued.  KBF

## 2020-01-31 ENCOUNTER — Ambulatory Visit: Payer: PRIVATE HEALTH INSURANCE | Admitting: Sports Medicine

## 2020-02-02 ENCOUNTER — Other Ambulatory Visit: Payer: PRIVATE HEALTH INSURANCE

## 2020-02-02 DIAGNOSIS — Z20822 Contact with and (suspected) exposure to covid-19: Secondary | ICD-10-CM

## 2020-02-04 LAB — SARS-COV-2, NAA 2 DAY TAT

## 2020-02-04 LAB — NOVEL CORONAVIRUS, NAA: SARS-CoV-2, NAA: DETECTED — AB

## 2020-02-05 ENCOUNTER — Telehealth: Payer: Self-pay

## 2020-02-05 NOTE — Telephone Encounter (Signed)
PT returning call for test results / positeve / please advise  

## 2020-02-05 NOTE — Telephone Encounter (Signed)
Called pt called back and questions answered. Pt has not met criteria for ending isolation (frequent cough and runny nose) Pt stated that her employer has advised despite not meeting criteria for ending self isolation- advised to in am to discuss further. Pt verbalized understanding.

## 2020-03-01 ENCOUNTER — Other Ambulatory Visit: Payer: Self-pay

## 2020-03-01 ENCOUNTER — Ambulatory Visit: Payer: PRIVATE HEALTH INSURANCE | Admitting: Sports Medicine

## 2020-03-01 DIAGNOSIS — S29011A Strain of muscle and tendon of front wall of thorax, initial encounter: Secondary | ICD-10-CM

## 2020-03-01 DIAGNOSIS — M25511 Pain in right shoulder: Secondary | ICD-10-CM

## 2020-03-01 DIAGNOSIS — M7751 Other enthesopathy of right foot: Secondary | ICD-10-CM

## 2020-03-01 NOTE — Progress Notes (Signed)
CC; RT rib pain  Patient recently had COVID Has residual cough and had a lot of RT ribcage pain PCP had treated with meloxicam 15, gabapentin 100 tid Patient has stopped this and felt she got weight gain while on meds. This is improved but still sore Wants to start back exercise  RT 2nd MTP joint capsulitis The pain and swelling totally resolved on amitriptyline However when she restarts exercise after a couple of days off med the swelling starts again  RT shoulder pain totally resolved with HEP  PE Pleasant F in NAD BP 122/84   Ht 5\' 4"  (1.626 m)   Wt 157 lb (71.2 kg)   BMI 26.95 kg/m   Right chest wall TTP directly over T 7 laterally to AAL Other ribs not tender No crepitation Lungs clear  RT 2nd MTP No swelling  Full ROM No TTP  RT shoulder with full ROM and good strength

## 2020-03-01 NOTE — Assessment & Plan Note (Signed)
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

## 2020-03-01 NOTE — Assessment & Plan Note (Signed)
Conservative care Use either ace or rib belt while lifting Ice daily

## 2020-03-01 NOTE — Assessment & Plan Note (Signed)
Resolved with HEP Doing well

## 2020-09-04 ENCOUNTER — Ambulatory Visit (INDEPENDENT_AMBULATORY_CARE_PROVIDER_SITE_OTHER): Payer: No Typology Code available for payment source | Admitting: Sports Medicine

## 2020-09-04 ENCOUNTER — Other Ambulatory Visit: Payer: Self-pay

## 2020-09-04 VITALS — Ht 64.0 in | Wt 170.0 lb

## 2020-09-04 DIAGNOSIS — M19079 Primary osteoarthritis, unspecified ankle and foot: Secondary | ICD-10-CM

## 2020-09-04 DIAGNOSIS — M778 Other enthesopathies, not elsewhere classified: Secondary | ICD-10-CM | POA: Diagnosis not present

## 2020-09-04 DIAGNOSIS — M2011 Hallux valgus (acquired), right foot: Secondary | ICD-10-CM | POA: Diagnosis not present

## 2020-09-04 DIAGNOSIS — M19071 Primary osteoarthritis, right ankle and foot: Secondary | ICD-10-CM | POA: Diagnosis not present

## 2020-09-04 MED ORDER — DICLOFENAC SODIUM 1 % EX GEL: 4.0000 g | Freq: Four times a day (QID) | CUTANEOUS | 3 refills | Status: AC

## 2020-09-04 NOTE — Patient Instructions (Signed)
It was great to meet you today, thank you for letting me participate in your care!  Today, we discussed the collapse of your foot arch causing your 1st great toe pain.  - we will bring you back with myself (Dr. Shon Baton) or Dr. Darrick Penna to build custom orthotics for you, find a time that works with you in the next few weeks - can try topical voltaren gel over painful area for local pain relief, can use this up to 4 times daily  You will follow-up in the next 1-3 weeks for orthotics.  If you have any further questions, please give the clinic a call [phone].  Cheers,  Madelyn Brunner, DO PGY-4, Sports Medicine Fellow [facility]
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
## 2020-09-25 ENCOUNTER — Ambulatory Visit: Payer: No Typology Code available for payment source | Admitting: Sports Medicine

## 2020-09-25 ENCOUNTER — Ambulatory Visit: Payer: PRIVATE HEALTH INSURANCE | Admitting: Sports Medicine

## 2020-09-25 ENCOUNTER — Other Ambulatory Visit: Payer: Self-pay

## 2020-09-25 VITALS — Ht 64.0 in | Wt 167.0 lb

## 2020-09-25 DIAGNOSIS — R269 Unspecified abnormalities of gait and mobility: Secondary | ICD-10-CM | POA: Diagnosis not present

## 2020-09-25 DIAGNOSIS — M19079 Primary osteoarthritis, unspecified ankle and foot: Secondary | ICD-10-CM

## 2020-09-25 NOTE — Progress Notes (Signed)
Patient was fitted for a: standard, cushioned, semi-rigid orthotic. The orthotic was heated and afterward the patient stood on the orthotic blank positioned on the orthotic stand. The patient was positioned in subtalar neutral position and 10 degrees of ankle dorsiflexion in a weight bearing stance. After completion of molding, a stable base was applied to the orthotic blank. The blank was ground to a stable position for weight bearing. Size: 8 Base: blue EVA Posting: none Additional orthotic padding: metatarsal pad placed on b/l orthotics  Total time spent with the patient was 30 minutes with greater than 50% of the time spent in face-to-face consultation discussing orthotic construction, instruction, and sitting. Gait was neutral with orthotics in place. Patient found them to be comfortable. Follow-up as needed.  Madelyn Brunner, DO PGY-4, Sports Medicine Fellow [facility]  I observed and examined the patient with the [facility] resident and agree with assessment and plan.  Note reviewed and modified by me. Sterling Big MD

## 2020-11-08 ENCOUNTER — Other Ambulatory Visit: Payer: Self-pay | Admitting: Obstetrics and Gynecology

## 2020-11-08 DIAGNOSIS — Z1231 Encounter for screening mammogram for malignant neoplasm of breast: Secondary | ICD-10-CM

## 2020-12-11 ENCOUNTER — Ambulatory Visit
Admission: RE | Admit: 2020-12-11 | Discharge: 2020-12-11 | Disposition: A | Discharge status: Home / Self Care | Payer: No Typology Code available for payment source | Source: Ambulatory Visit | Attending: Obstetrics and Gynecology | Admitting: Obstetrics and Gynecology

## 2020-12-11 DIAGNOSIS — Z1231 Encounter for screening mammogram for malignant neoplasm of breast: Secondary | ICD-10-CM

## 2021-03-14 ENCOUNTER — Encounter: Payer: Self-pay | Admitting: Nurse Practitioner

## 2021-07-01 ENCOUNTER — Other Ambulatory Visit (HOSPITAL_COMMUNITY): Payer: Self-pay

## 2021-07-01 MED ORDER — WEGOVY 2.4 MG/0.75ML ~~LOC~~ SOAJ
SUBCUTANEOUS | 2 refills | Status: AC
  Filled 2021-07-01 – 2021-07-05 (×3): qty 3, 28d supply, fill #0
  Filled 2022-04-12: qty 3, 28d supply, fill #1
  Filled 2022-05-10: qty 3, 28d supply, fill #2

## 2021-07-01 MED ORDER — SEMAGLUTIDE-WEIGHT MANAGEMENT 2.4 MG/0.75ML ~~LOC~~ SOAJ
SUBCUTANEOUS | 2 refills | Status: AC
  Filled 2021-07-01 – 2021-07-28 (×3): qty 3, 28d supply, fill #0
  Filled 2021-11-20 – 2022-01-13 (×2): qty 3, 28d supply, fill #1
  Filled 2022-02-06 – 2022-03-15 (×2): qty 3, 28d supply, fill #2

## 2021-07-03 ENCOUNTER — Other Ambulatory Visit (HOSPITAL_COMMUNITY): Payer: Self-pay

## 2021-07-04 ENCOUNTER — Other Ambulatory Visit (HOSPITAL_COMMUNITY): Payer: Self-pay

## 2021-07-05 ENCOUNTER — Other Ambulatory Visit (HOSPITAL_COMMUNITY): Payer: Self-pay

## 2021-07-29 ENCOUNTER — Other Ambulatory Visit (HOSPITAL_COMMUNITY): Payer: Self-pay

## 2021-07-31 ENCOUNTER — Other Ambulatory Visit (HOSPITAL_COMMUNITY): Payer: Self-pay

## 2021-07-31 MED ORDER — WEGOVY 2.4 MG/0.75ML ~~LOC~~ SOAJ
SUBCUTANEOUS | 2 refills | Status: AC
  Filled 2021-07-31 – 2022-06-08 (×2): qty 3, 28d supply, fill #0
  Filled 2022-07-07: qty 3, 28d supply, fill #1

## 2021-08-01 ENCOUNTER — Other Ambulatory Visit (HOSPITAL_COMMUNITY): Payer: Self-pay

## 2021-08-19 ENCOUNTER — Other Ambulatory Visit (HOSPITAL_COMMUNITY): Payer: Self-pay

## 2021-08-19 MED ORDER — WEGOVY 2.4 MG/0.75ML ~~LOC~~ SOAJ
SUBCUTANEOUS | 2 refills | Status: AC
  Filled 2021-08-19 – 2021-09-02 (×2): qty 3, 28d supply, fill #0
  Filled 2022-08-04: qty 3, 28d supply, fill #1

## 2021-08-20 ENCOUNTER — Other Ambulatory Visit (HOSPITAL_COMMUNITY): Payer: Self-pay

## 2021-08-29 ENCOUNTER — Other Ambulatory Visit (HOSPITAL_COMMUNITY): Payer: Self-pay

## 2021-09-02 ENCOUNTER — Other Ambulatory Visit (HOSPITAL_COMMUNITY): Payer: Self-pay

## 2021-09-23 ENCOUNTER — Other Ambulatory Visit (HOSPITAL_COMMUNITY): Payer: Self-pay

## 2021-09-23 MED ORDER — WEGOVY 2.4 MG/0.75ML ~~LOC~~ SOAJ
SUBCUTANEOUS | 2 refills | Status: AC
  Filled 2021-09-23: qty 3, 28d supply, fill #0
  Filled 2021-10-20: qty 3, 28d supply, fill #1
  Filled 2021-11-20 – 2022-02-13 (×3): qty 3, 28d supply, fill #2

## 2021-09-25 ENCOUNTER — Other Ambulatory Visit (HOSPITAL_COMMUNITY): Payer: Self-pay

## 2021-10-21 ENCOUNTER — Other Ambulatory Visit (HOSPITAL_COMMUNITY): Payer: Self-pay

## 2021-10-23 ENCOUNTER — Other Ambulatory Visit (HOSPITAL_COMMUNITY): Payer: Self-pay

## 2021-11-21 ENCOUNTER — Other Ambulatory Visit (HOSPITAL_COMMUNITY): Payer: Self-pay

## 2021-11-21 MED ORDER — WEGOVY 2.4 MG/0.75ML ~~LOC~~ SOAJ
2.4000 mg | SUBCUTANEOUS | 2 refills | Status: AC
  Filled 2021-11-21 – 2021-12-16 (×2): qty 3, 28d supply, fill #0

## 2021-11-30 ENCOUNTER — Other Ambulatory Visit (HOSPITAL_COMMUNITY): Payer: Self-pay

## 2021-12-17 ENCOUNTER — Other Ambulatory Visit (HOSPITAL_COMMUNITY): Payer: Self-pay

## 2021-12-20 ENCOUNTER — Other Ambulatory Visit (HOSPITAL_COMMUNITY): Payer: Self-pay

## 2022-01-01 ENCOUNTER — Other Ambulatory Visit (HOSPITAL_COMMUNITY): Payer: Self-pay

## 2022-02-06 ENCOUNTER — Other Ambulatory Visit (HOSPITAL_COMMUNITY): Payer: Self-pay

## 2022-02-13 ENCOUNTER — Other Ambulatory Visit (HOSPITAL_COMMUNITY): Payer: Self-pay

## 2022-02-20 ENCOUNTER — Other Ambulatory Visit (HOSPITAL_COMMUNITY): Payer: Self-pay

## 2022-02-25 ENCOUNTER — Other Ambulatory Visit (HOSPITAL_COMMUNITY): Payer: Self-pay

## 2022-02-25 MED ORDER — HYDROCODONE BIT-HOMATROP MBR 5-1.5 MG/5ML PO SOLN
5.0000 mL | Freq: Three times a day (TID) | ORAL | 0 refills | Status: DC | PRN
  Filled 2022-02-25: qty 120, 8d supply, fill #0

## 2022-02-28 ENCOUNTER — Other Ambulatory Visit (HOSPITAL_COMMUNITY): Payer: Self-pay

## 2022-04-14 ENCOUNTER — Other Ambulatory Visit (HOSPITAL_COMMUNITY): Payer: Self-pay

## 2022-05-16 ENCOUNTER — Other Ambulatory Visit (HOSPITAL_COMMUNITY): Payer: Self-pay

## 2022-06-09 ENCOUNTER — Other Ambulatory Visit (HOSPITAL_COMMUNITY): Payer: Self-pay

## 2022-06-14 ENCOUNTER — Other Ambulatory Visit (HOSPITAL_COMMUNITY): Payer: Self-pay

## 2022-06-17 ENCOUNTER — Other Ambulatory Visit (HOSPITAL_COMMUNITY): Payer: Self-pay

## 2022-06-17 MED ORDER — WEGOVY 2.4 MG/0.75ML ~~LOC~~ SOAJ
2.4000 mg | SUBCUTANEOUS | 0 refills | Status: DC
  Filled 2022-06-17 – 2022-07-07 (×3): qty 3, 28d supply, fill #0
  Filled 2022-08-04: qty 9, 84d supply, fill #0

## 2022-06-18 ENCOUNTER — Other Ambulatory Visit (HOSPITAL_COMMUNITY): Payer: Self-pay

## 2022-06-30 ENCOUNTER — Other Ambulatory Visit (HOSPITAL_COMMUNITY): Payer: Self-pay

## 2022-07-07 ENCOUNTER — Other Ambulatory Visit: Payer: Self-pay

## 2022-07-07 ENCOUNTER — Other Ambulatory Visit (HOSPITAL_COMMUNITY): Payer: Self-pay

## 2022-08-04 ENCOUNTER — Other Ambulatory Visit (HOSPITAL_COMMUNITY): Payer: Self-pay

## 2022-08-05 ENCOUNTER — Other Ambulatory Visit: Payer: Self-pay
